# Patient Record
Sex: Male | Born: 1980 | Race: White | Hispanic: No | Marital: Married | Smoking: Never smoker
Health system: Southern US, Community
[De-identification: ages and names within clinical notes are randomized; demographics above are authoritative.]

## PROBLEM LIST (undated history)

## (undated) DIAGNOSIS — T7840XA Allergy, unspecified, initial encounter: Secondary | ICD-10-CM

## (undated) HISTORY — PX: UPPER GASTROINTESTINAL ENDOSCOPY: SHX188

---

## 2014-09-05 ENCOUNTER — Emergency Department (HOSPITAL_COMMUNITY)
Admission: EM | Admit: 2014-09-05 | Discharge: 2014-09-05 | Disposition: A | Discharge status: Home / Self Care | Payer: Worker's Compensation | Attending: Emergency Medicine | Admitting: Emergency Medicine

## 2014-09-05 ENCOUNTER — Encounter (HOSPITAL_COMMUNITY): Payer: Self-pay | Admitting: *Deleted

## 2014-09-05 ENCOUNTER — Emergency Department (HOSPITAL_COMMUNITY): Payer: Worker's Compensation

## 2014-09-05 DIAGNOSIS — W260XXA Contact with knife, initial encounter: Secondary | ICD-10-CM | POA: Diagnosis not present

## 2014-09-05 DIAGNOSIS — R52 Pain, unspecified: Secondary | ICD-10-CM

## 2014-09-05 DIAGNOSIS — Y9389 Activity, other specified: Secondary | ICD-10-CM | POA: Diagnosis not present

## 2014-09-05 DIAGNOSIS — S61012A Laceration without foreign body of left thumb without damage to nail, initial encounter: Secondary | ICD-10-CM

## 2014-09-05 DIAGNOSIS — S61112A Laceration without foreign body of left thumb with damage to nail, initial encounter: Secondary | ICD-10-CM | POA: Diagnosis not present

## 2014-09-05 DIAGNOSIS — Z23 Encounter for immunization: Secondary | ICD-10-CM | POA: Insufficient documentation

## 2014-09-05 DIAGNOSIS — Y998 Other external cause status: Secondary | ICD-10-CM | POA: Insufficient documentation

## 2014-09-05 DIAGNOSIS — Y929 Unspecified place or not applicable: Secondary | ICD-10-CM | POA: Insufficient documentation

## 2014-09-05 MED ORDER — HYDROCODONE-ACETAMINOPHEN 5-325 MG PO TABS: 1.0000 | ORAL_TABLET | ORAL | Status: DC | PRN

## 2014-09-05 MED ORDER — NAPROXEN 500 MG PO TABS: 500.0000 mg | ORAL_TABLET | Freq: Two times a day (BID) | ORAL | Status: DC

## 2014-09-05 MED ORDER — CEPHALEXIN 500 MG PO CAPS: 500.0000 mg | ORAL_CAPSULE | Freq: Four times a day (QID) | ORAL | Status: DC

## 2014-09-05 MED ORDER — TETANUS-DIPHTH-ACELL PERTUSSIS 5-2.5-18.5 LF-MCG/0.5 IM SUSP
0.5000 mL | Freq: Once | INTRAMUSCULAR | Status: AC
  Administered 2014-09-05: 0.5 mL via INTRAMUSCULAR
  Filled 2014-09-05: qty 0.5

## 2014-09-05 MED ORDER — LIDOCAINE HCL 2 % IJ SOLN
10.0000 mL | Freq: Once | INTRAMUSCULAR | Status: AC
Start: 2014-09-05 — End: 2014-09-05
  Administered 2014-09-05: 200 mg
  Filled 2014-09-05: qty 20

## 2014-09-05 NOTE — ED Provider Notes (Signed)
CSN: [id number]     Arrival date & time 09/05/14  1653 History  This chart was scribed for Joe Pel, Joe Griffith, working with Doug Sou, MD by Chestine Spore, ED Scribe. The patient was seen in room TR09C/TR09C at 6:26 PM.    Chief Complaint  Patient presents with  . Laceration      The history is provided by the patient. No language interpreter was used.    HPI Comments: Joe Griffith is a 34 y.o. male who presents to the Emergency Department complaining of laceration onset today 2 hours ago PTA. Pt was chopping cooked lobster with a knife when he cut through his left thumb nail. 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 Pt notes that his finger feels numb at this time and he denies throbbing at this time. Pt is able to move his thumb without any issues. He denies any other symptoms.   History reviewed. No pertinent past medical history. History reviewed. No pertinent past surgical history. No family history on file. History  Substance Use Topics  . Smoking status: Never Smoker   . Smokeless tobacco: Not on file  . Alcohol Use: Yes    Review of Systems  Skin: Positive for wound (laceration to the thumb nail).      Allergies  Review of patient's allergies indicates no known allergies.  Home Medications   Prior to Admission medications   Medication Sig Start Date End Date Taking? Authorizing Provider  cephALEXin (KEFLEX) 500 MG capsule Take 1 capsule (500 mg total) by mouth 4 (four) times daily. 09/05/14   Joe Pel, Joe Griffith  HYDROcodone-acetaminophen (NORCO/VICODIN) 5-325 MG per tablet Take 1-2 tablets by mouth every 4 (four) hours as needed. 09/05/14   Selam Pietsch Neva Seat, Joe Griffith  naproxen (NAPROSYN) 500 MG tablet Take 1 tablet (500 mg total) by mouth 2 (two) times daily. 09/05/14   Areal Cochrane Neva Seat, Joe Griffith   BP 115/60 mmHg  Pulse 77  Temp(Src) 97.6 F (36.4 C)  Resp 16  Ht  (1.753 m)  Wt 150 lb  (68.04 kg)  BMI 22.14 kg/m2  SpO2 99%  Physical Exam  Constitutional: He is oriented to person, place, and time. He appears well-developed and well-nourished. No distress.  HENT:  Head: Normocephalic and atraumatic.  Eyes: EOM are normal.  Neck: Neck supple. No tracheal deviation present.  Cardiovascular: Normal rate.   Pulmonary/Chest: Effort normal. No respiratory distress.  Musculoskeletal: Normal range of motion.       Hands: Laceration to thumb that extends through the distal portion of the nailbed radial side and wraps around towards the posterior portion of the finger. No tendon disruption. Laceration is deep and complex. NVI  Neurological: He is alert and oriented to person, place, and time.  Skin: Skin is warm and dry.  Psychiatric: He has a normal mood and affect. His behavior is normal.  Nursing note and vitals reviewed.   ED Course  Procedures (including critical care time) DIAGNOSTIC STUDIES: Oxygen Saturation is 99% on RA, normal by my interpretation.    COORDINATION OF CARE: 6:32 PM-Discussed treatment plan which includes laceration repair and finger splint with pt at bedside and pt agreed to plan.   Labs Review Labs Reviewed - No data to display  Imaging Review Dg Finger Thumb Left  09/05/2014   CLINICAL DATA:  Thumb injury, laceration through the thumb nail, initial encounter.  EXAM: LEFT THUMB 2+V  COMPARISON:  None.  FINDINGS: Soft tissue swelling over the distal phalanx without underlying acute osseous or joint abnormality. No radiopaque foreign body.  IMPRESSION: Soft tissue injury along the distal thumb without underlying osseous or joint abnormality. No radiopaque foreign body.   Electronically Signed   By: Leanna BattlesMelinda  Blietz M.D.   On: 09/05/2014 18:31     EKG Interpretation None      MDM   Final diagnoses:  Thumb laceration, left, initial encounter    LACERATION REPAIR Performed by: Dorthula MatasGREENE,Kateryn Marasigan G Authorized by: Dorthula MatasGREENE,Alekxander Isola G Consent:  Verbal consent obtained. Risks and benefits: risks, benefits and alternatives were discussed Consent given by: patient Patient identity confirmed: provided demographic data Prepped and Draped in normal sterile fashion Wound explored  Laceration Location: left thumb  Laceration Length: 2 cm  No Foreign Bodies seen or palpated  Anesthesia: Digital Block  Local anesthetic: lidocaine 2% wo epinephrine  Anesthetic total: 4 ml  Irrigation method: syringe Amount of cleaning: standard  Skin closure: sutures  Number of sutures: 6  Technique: simple interrupted, two through the nail bed.  Patient tolerance: Patient tolerated the procedure well with no immediate complications.  Patients lac to distal finger is complex and involves the nail bed. Will start on Keflex, placed in finger splint and refer to hand for follow-up.  34 y.o.Joe Griffith's evaluation in the Emergency Department is complete. It has been determined that no acute conditions requiring further emergency intervention are present at this time. The patient/guardian have been advised of the diagnosis and plan. We have discussed signs and symptoms that warrant return to the ED, such as changes or worsening in symptoms.  Vital signs are stable at discharge. Filed Vitals:   09/05/14 1713  BP: 115/60  Pulse: 77  Temp: 97.6 F (36.4 C)  Resp: 16    Patient/guardian has voiced understanding and agreed to follow-up with the PCP or specialist.   I personally performed the services described in this documentation, which was scribed in my presence. The recorded information has been reviewed and is accurate.   Joe Peliffany Lizza Huffaker, Joe Griffith 09/05/14 2028  Doug SouSam Jacubowitz, MD 09/06/14 [phone]

## 2014-09-05 NOTE — ED Notes (Signed)
The pt has a lac through his lthtumb nail. He did while chopping with a knife just pta.  No active bleeding bandaged

## 2014-09-05 NOTE — Discharge Instructions (Signed)
Fingertip Injuries and Amputations °Fingertip injuries are common and often get injured because they are last to escape when pulling your hand out of harm's way. You have amputated (cut off) part of your finger. How this turns out depends largely on how much was amputated. If just the tip is amputated, often the end of the finger will grow back and the finger may return to much the same as it was before the injury.  °If more of the finger is missing, your caregiver has done the best with the tissue remaining to allow you to keep as much finger as is possible. Your caregiver after checking your injury has tried to leave you with a painless fingertip that has durable, feeling skin. If possible, your caregiver has tried to maintain the finger's length and appearance and preserve its fingernail.  °Please read the instructions outlined below and refer to this sheet in the next few weeks. These instructions provide you with general information on caring for yourself. Your caregiver may also give you specific instructions. While your treatment has been done according to the most current medical practices available, unavoidable complications occasionally occur. If you have any problems or questions after discharge, please call your caregiver. °HOME CARE INSTRUCTIONS  °· You may resume normal diet and activities as directed or allowed. °· Keep your hand elevated above the level of your heart. This helps decrease pain and swelling. °· Keep ice packs (or a bag of ice wrapped in a towel) on the injured area for 15-20 minutes, 03-04 times per day, for the first two days. °· Change dressings if necessary or as directed. °· Clean the wound daily or as directed. °· Only take over-the-counter or prescription medicines for pain, discomfort, or fever as directed by your caregiver. °· Keep appointments as directed. °SEEK IMMEDIATE MEDICAL CARE IF: °· You develop redness, swelling, numbness or increasing pain in the wound. °· There is  pus coming from the wound. °· You develop an unexplained oral temperature above 102° F (38.9° C) or as your caregiver suggests. °· There is a foul (bad) smell coming from the wound or dressing. °· There is a breaking open of the wound (edges not staying together) after sutures or staples have been removed. °MAKE SURE YOU:  °· Understand these instructions. °· Will watch your condition. °· Will get help right away if you are not doing well or get worse. °Document Released: 04/12/2005 Document Revised: 08/14/2011 Document Reviewed: 03/11/2008 °ExitCare® Patient Information ©2015 [employer]. This information is not intended to replace advice given to you by your health care provider. Make sure you discuss any questions you have with your health care provider. ° °

## 2019-06-04 ENCOUNTER — Other Ambulatory Visit: Payer: Self-pay

## 2019-06-04 ENCOUNTER — Ambulatory Visit: Payer: BC Managed Care – PPO | Attending: Internal Medicine

## 2019-06-04 DIAGNOSIS — Z20822 Contact with and (suspected) exposure to covid-19: Secondary | ICD-10-CM

## 2019-06-05 LAB — NOVEL CORONAVIRUS, NAA: SARS-CoV-2, NAA: NOT DETECTED

## 2019-06-30 ENCOUNTER — Ambulatory Visit: Payer: BC Managed Care – PPO | Attending: Internal Medicine

## 2019-06-30 ENCOUNTER — Other Ambulatory Visit: Payer: Self-pay

## 2019-06-30 DIAGNOSIS — Z20822 Contact with and (suspected) exposure to covid-19: Secondary | ICD-10-CM

## 2019-07-01 LAB — NOVEL CORONAVIRUS, NAA: SARS-CoV-2, NAA: NOT DETECTED

## 2019-12-23 ENCOUNTER — Ambulatory Visit
Admission: RE | Admit: 2019-12-23 | Discharge: 2019-12-23 | Disposition: A | Discharge status: Home / Self Care | Payer: BC Managed Care – PPO | Source: Ambulatory Visit | Attending: Sports Medicine | Admitting: Sports Medicine

## 2019-12-23 ENCOUNTER — Ambulatory Visit: Payer: BC Managed Care – PPO | Admitting: Sports Medicine

## 2019-12-23 ENCOUNTER — Encounter: Payer: Self-pay | Admitting: Sports Medicine

## 2019-12-23 ENCOUNTER — Other Ambulatory Visit: Payer: Self-pay

## 2019-12-23 VITALS — BP 128/78 | Ht 69.0 in | Wt 154.0 lb

## 2019-12-23 DIAGNOSIS — G8929 Other chronic pain: Secondary | ICD-10-CM | POA: Diagnosis not present

## 2019-12-23 DIAGNOSIS — M545 Low back pain, unspecified: Secondary | ICD-10-CM

## 2019-12-23 NOTE — Progress Notes (Signed)
Office Visit Note   Patient: Joe Griffith           Date of Birth: [birth year]           MRN: [id number] Visit Date: 12/23/2019 Requested by: No referring provider defined for this encounter. PCP: No primary care provider on file.  Subjective: Chronic Low Back Pain  HPI: Patient is a 39 year old male presented to clinic for chronic low back pain which has been bothering him for 10 years.  He denies any specific trauma when the pain started, and describes an insidious worsening despite Advil attempts at stretching, back exercises, core exercises, chiropractic care, and physical therapy treatment.  Patient states that pain has in the past radiated to bilateral legs (L>R), though it primarily stays local in his back.  It is focused just above his hips and tends to affect his left side more than his right.  He was given meloxicam by his primary care manager and he also uses a TENS unit, heating pad, and massage, all with very brief improvement.  He states that any bending movements cause significant worsening in his pain.  He is also noticed that if he takes a day off of his exercise routine his pain will significantly worsen, and he feels very stiff afterward.  However, if he is diligent with his exercises and is stretching he feels "pretty good."  He denies a family history of autoimmune diseases, though states his father does have a "bad back like I do."  He has a history of scoliosis that was diagnosed in childhood, though admits he has ignored this diagnosis for most of his life.  He is worried that his worsening pain is a sign that his scoliosis is catching up with him.  He denies any weakness in his feet, or numbness in his toes or legs.  No trauma that he can recall.  Patient states that overall he is very "frustrated" today because he is doing everything he can think of to improve his symptoms, and they remain a daily burden to him.              ROS:   All other systems were reviewed and are  negative.  Objective: Vital Signs: BP 128/78   Ht 5\' 9"  (1.753 m)   Wt 154 lb (69.9 kg)   BMI 22.74 kg/m   Physical Exam:  General:  Alert and oriented, in no acute distress. Pulm:  Breathing unlabored. Psy:  Normal mood, congruent affect. Skin:  No rashes or bruising on examination.   BACK EXAMINATION: Normal Gait.  Normal Spinal curvature, without excessive lumbar lordosis, or thoracic kyphosis, or. Subtle Left rib-hump with forward flexion.  ROM: Endorses pain with with forward flexion > extension. Able to achieve Toe-Touch. No pain with Rotation or Side-bending.  Palpation: No midline tenderness, no deformity or step-offs. Minimal lumbar paraspinal muscle tenderness (L>R), and no tenderness over the SI Joints bilaterally. Gluteus musculature and piriformis with tender points on left, though this does not cause radiation into leg. Negative Sacral Spring's test.  Strength: Hip flexion (L1), Hip Aduction (L2), Knee Extension (L3) are 5/5 Bilaterally. Sensation: Intact to light touch medial and lateral aspects of lower extremities, and lateral, dorsal, and medial aspects of foot.  Reflexes: Patellar (L4), and Ankle (S1) 2+ Bilaterally Special Tests:  FABER and Gaenslens cause No SI Joint Pain. Piriformis test: No pain or radiation into leg with hip flexion, adduction SLR: No radiation down Ipilateral or contralateral leg bilaterally. Limb  Length: Hips Aligned, No obvious discrepancy at medial malleolus.     Imaging: No results found.  Assessment & Plan: Patient is a 39 year old male presenting to clinic today with concerns of 10 years of chronic low back pain.  He denies injury at the onset of his symptoms, and states that it has insidiously worsened despite his best attempts at staying active as recommended by his primary care.  Examination as described above with no red flag signs or symptoms.  Does have possible subtle scoliotic curve as evidenced by left-sided rib hump with  forward flexion, which may be underlying his some of his chronic pain.   -We will obtain lumbar and pelvic films to evaluate for underlying bony pathology which could be causing his symptoms. -Patient was encouraged to reach out to physical therapy for consideration of Pilates, which may offer excellent benefit to his core strengthening routine. -Given lack of red flag symptoms no indication for MRI today.  Patient will follow up pending physical therapy and aforementioned radiographs for further evaluation as needed.  Patient had no further questions or concerns and was agreeable with plan.  Patient seen and evaluated with the sports medicine fellow.  I agree with the above plan of care.  X-rays are reviewed.  Nothing acute is seen.  Patient has minimal disc space narrowing at L4-L5 but otherwise unremarkable.  I would like for the patient to try Pilates and follow-up with [place] in 6 weeks.  Call with questions or concerns in the interim.

## 2020-02-03 ENCOUNTER — Ambulatory Visit: Payer: BC Managed Care – PPO | Admitting: Sports Medicine

## 2020-02-03 ENCOUNTER — Other Ambulatory Visit: Payer: Self-pay

## 2020-02-03 VITALS — BP 118/72 | Ht 69.0 in | Wt 153.0 lb

## 2020-02-03 DIAGNOSIS — M545 Low back pain: Secondary | ICD-10-CM

## 2020-02-03 DIAGNOSIS — G8929 Other chronic pain: Secondary | ICD-10-CM | POA: Diagnosis not present

## 2020-02-03 NOTE — Progress Notes (Signed)
Office Visit Note   Patient: Joe Griffith           Date of Birth: [birth year]           MRN: [id number] Visit Date: 02/03/2020 Requested by: No referring provider defined for this encounter. PCP: Darrow Bussing, MD  Subjective: CC: F/U Low Back Pain  HPI: 39 year old male presenting to clinic for 6-week follow-up for low back pain.  At previous encounter patient was diagnosed with mechanical low back pain, and it was recommended that he engage with PT Pilates for core and hip exercises to improve her symptoms.  He states that he goes to the Pilates studio once weekly, in addition to streaming Pilates workouts at home, and feels that overall this has improved his symptoms.  Previously, he would be able to do 1 round of golf on weekends, and then be in significant pain the next day.  Last weekend, however, he was able to complete 18 holes on Friday, and play another 18 holes on Saturday without much discomfort.  He does admit that he was stiff on Sunday, but he was able to do his Pilates exercises and this significantly improved that pain and stiffness.  He feels very happy with how things are progressing, though feels that he still has room to improve.  He is optimistic that as Covid restrictions eventually release, he will be able to return to the gym for cardio as well as weight lifting, which she thinks will complement the Pilates and get him feeling much better overall.  He denies numbness, weakness, or bowel or bladder dysfunction.               ROS:   All other systems were reviewed and are negative.  Objective: Vital Signs: BP 118/72   Ht 5\' 9"  (1.753 m)   Wt 153 lb (69.4 kg)   BMI 22.59 kg/m   Physical Exam:  General:  Alert and oriented, in no acute distress. Pulm:  Breathing unlabored. Psy:  Normal mood, congruent affect. Skin: No bruising or rashes appreciated. BACK EXAMINATION: Normal Gait.  Normal Spinal curvature, without excessive lumbar lordosis, thoracic kyphosis, or  scoliosis.  ROM: No Pain with forward flexion or extension. Able to achieve Toe-Touch (which is improved from past visit). No pain with Rotation or Side-bending.  Palpation: No midline tenderness, no deformity or step-offs. No paraspinal muscle tenderness. Does endorse minimal pain over bilateral SI joints.  Some tenderness with palpation of gluten medius bilaterally.  Negative Sacral Spring's test.  Osteopathic Examination:L5-L3NRRSL Strength: Hip flexion (L1), Hip Aduction (L2), Knee Extension (L3) are 5/5 Bilaterally Foot Inversion (L4), Dorsiflexion (L5), and Eversion (S1) 5/5 Bilaterally Sensation: Intact to light touch medial and lateral aspects of lower extremities, and lateral, dorsal, and medial aspects of foot.   Imaging: CLINICAL DATA:  Low back pain extending into the lower extremities, left greater than right.  EXAM: LUMBAR SPINE - 2-3 VIEW  COMPARISON:  None.  FINDINGS: Five non rib-bearing lumbar type vertebral bodies are present. Transitional L5 segment is noted. Vertebral body heights are maintained. Narrowing of disc space is noted at L4-5. Facet degenerative changes are present at L4-5 as well. Disc spaces are otherwise maintained. No focal osseous lesions are evident.  IMPRESSION: 1. 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 2. Transitional L5 segment.   Electronically Signed   By: M.D.   On: 12/24/2019 17:06  Assessment & Plan: 39 year old male presenting to clinic to follow-up on mechanical  lower back pain.  Overall he feels that his symptoms have improved since starting Pilates as recommended at her last encounter.  He says that he is pleasantly surprised by the improvements he has made, and by his increased exercise tolerance.  -Discussed continued Pilates, as this improves core strengthening, flexibility, and should overall help support his lumbar spine.  -Reviewed radiographs obtained at previous encounter, which did  demonstrate some degenerative changes of L4-L5.  This furthers the need for core strengthening and spinal stability exercises.  -Follow-up as needed should pain worsen, or trajectory of improvement failed to offer adequate relief with advancing in Pilates PT.  -Patient had no further questions or concerns at completion of today's visit.    Patient seen and evaluated with the sports medicine fellow.  I agree with the above plan of care.  Patient has noticed some initial improvement with Pilates.  I have encouraged him to continue with this indefinitely.  No restrictions on activity.  Follow-up as needed.

## 2020-08-09 ENCOUNTER — Other Ambulatory Visit: Payer: Self-pay

## 2020-08-09 ENCOUNTER — Ambulatory Visit
Admission: RE | Admit: 2020-08-09 | Discharge: 2020-08-09 | Disposition: A | Discharge status: Home / Self Care | Payer: BC Managed Care – PPO | Source: Ambulatory Visit | Attending: Sports Medicine | Admitting: Sports Medicine

## 2020-08-09 ENCOUNTER — Ambulatory Visit: Payer: BC Managed Care – PPO | Admitting: Sports Medicine

## 2020-08-09 VITALS — BP 130/78 | Ht 69.0 in | Wt 155.0 lb

## 2020-08-09 DIAGNOSIS — M25512 Pain in left shoulder: Secondary | ICD-10-CM | POA: Diagnosis not present

## 2020-08-09 DIAGNOSIS — M25532 Pain in left wrist: Secondary | ICD-10-CM | POA: Diagnosis not present

## 2020-08-09 NOTE — Progress Notes (Signed)
   Subjective:    Patient ID: Joe Griffith, male    DOB: August 27, 1980, 40 y.o.   MRN: [id number]  HPI chief complaint: Left shoulder and left wrist pain  Joe Griffith comes in today complaining of left shoulder and wrist pain that began after he slipped on some ice in early February.  As his foot slipped out from under him he suffered a hyperextension injury to his left shoulder as well as an injury to the radial side of his left wrist.  At the time of his injury he felt a "crunch" but was able to get up and move around okay.  Since then, he has had diffuse shoulder pain with certain movements, especially with external rotation.  He has had difficulty sleeping on this side at night.  He denies any problems with the shoulder in the past.  He did not notice any bruising or swelling at the time of his injury.  No prior shoulder surgeries.  In regards to the right wrist, he has radial sided wrist pain when doing any sort of weightbearing exercise.  He did not notice any swelling at the time of his injury.  He denies any pain along the ulnar wrist.  Interim medical history reviewed Medications reviewed Allergies reviewed    Review of Systems    As above Objective:   Physical Exam  Well-developed, well-nourished.  No acute distress.  Left shoulder: Patient demonstrates full active and passive range of motion.  No tenderness to palpation along the clavicle, acromioclavicular joint, or bicipital groove.  No obvious swelling.  Rotator cuff strength is 5/5 and does not reproduce pain.  Negative O'Brien's.  He does have pain with apprehension testing.  Left wrist: Full range of motion.  No effusion.  No soft tissue swelling.  He is tender to palpation diffusely along the distal radius.  No tenderness to palpation in the anatomic snuffbox.  No tenderness over the scapholunate ligament nor over the TFCC.  Good pulses.  X-rays of the left shoulder including AP, and axillary views as well as x-rays of the left  wrist including AP, lateral, and scaphoid view show no obvious fracture.      Assessment & Plan:  Left shoulder pain-rule out rotator cuff tear Left wrist pain likely secondary to contusion  Patient will return to the office in the next few days for an ultrasound of his left shoulder specifically to rule out rotator cuff tear.  We will place him into a thumb spica brace for the left wrist for the next couple of weeks.  If pain persists thereafter, we may need to consider further diagnostic imaging.

## 2020-08-10 ENCOUNTER — Ambulatory Visit: Payer: BC Managed Care – PPO | Admitting: Sports Medicine

## 2020-08-12 ENCOUNTER — Ambulatory Visit (INDEPENDENT_AMBULATORY_CARE_PROVIDER_SITE_OTHER): Payer: BC Managed Care – PPO | Admitting: Sports Medicine

## 2020-08-12 ENCOUNTER — Other Ambulatory Visit: Payer: Self-pay

## 2020-08-12 ENCOUNTER — Ambulatory Visit: Payer: Self-pay

## 2020-08-12 VITALS — BP 110/78 | Ht 69.0 in | Wt 155.0 lb

## 2020-08-12 DIAGNOSIS — M25512 Pain in left shoulder: Secondary | ICD-10-CM

## 2020-08-12 NOTE — Patient Instructions (Signed)
There is no evidence of a rotator cuff tear on the ultrasound today, but that doesn't exclude the possibility of a labral tear (Labrums are very hard to see on Ultrasound).  - Despite not having a Rotator Cuff injury, doing exercises for your Rotator Cuff can help support the labrum and help it heal. - Do these Exercises twice daily until your follow up appointment.  - Take Ibuprofen, Tylenol as you need to for pain - Follow up in 4 weeks

## 2020-08-12 NOTE — Progress Notes (Addendum)
   Office Visit Note   Patient: Joe Griffith           Date of Birth: [birth year]           MRN: [id number] Visit Date: 08/12/2020 Requested by: Darrow Bussing, MD [address] [place],  [place] [address] PCP: Darrow Bussing, MD  Subjective: CC: Left Shoulder Ultrasound  HPI: 40 year old male presenting to clinic for dedicated left shoulder ultrasound.  Patient initially injured his shoulder approximately 1 month ago in early February, when he slipped on ice and jerked his shoulder behind him.  Since this time, he has had intermittent pain of the left shoulder, specifically when trying to sleep on the side.  He has difficulty pinpointing where the exact pain is located, as well as has trouble with knowing exactly what movements or exercises aggravated.  He states "I just know something is not right and there."              ROS:   All other systems were reviewed and are negative.  Objective: Vital Signs: BP 110/78   Ht 5\' 9"  (1.753 m)   Wt 155 lb (70.3 kg)   BMI 22.89 kg/m   Physical Exam:  General:  Alert and oriented, in no acute distress. Pulm:  Breathing unlabored. Psy:  Normal mood, congruent affect. Skin: Left shoulder overlying skin intact.  No bruising, no rashes, no erythema. Left shoulder with no obvious asymmetry.  Full range of motion in bilateral shoulders without any pain.   Full strength and no pain with empty can, does endorse pain with O'Brien/speeds as well as pain with crank, though no obvious clunks are appreciated.  Imaging: Upper extremity ultrasound- Left shoulder:  Biceps tendon visualized in long and short axis, with no significant surrounding fluid.  Fibers appear intact. Subscapularis tendon intact, with no significant surrounding effusion. Supraspinatus appears intact, no significant cortical irregularities, effusions, or intratendinous defects. No significant enlargement of subacromial bursa appreciated. Infraspinatus and teres minor  both appear intact, without surrounding fluid. AC joint visualized, no significant bony spurring.  Negative geyser sign. Glenohumeral joint visualized without significant bony spurring or effusion.  No obvious posterior labral pathology.  Impression: Unremarkable left shoulder ultrasound   Assessment & Plan: 40 year old male presenting to clinic for left shoulder ultrasound after injuring it while falling on the ice approximately 1 month ago.  Ultrasound performed as described above, with no obvious rotator cuff pathology.  He has no fluid around the biceps or AC joint, and no obvious posterior labral pathology- though this is difficult to fully visualize. -Reassuring against rotator cuff pathology, suspect smaller labral injury that is difficult to pick up on the ultrasound. -We will start conservative care with shoulder stabilization and scapular stabilization exercises. -Return to clinic in 4 weeks for reevaluation.  If patient has no improvement at that time, could consider advanced imaging. -She expresses understanding with plan.   Patient seen and evaluated with the sports medicine fellow.  Ultrasound findings as above.  No obvious rotator cuff tear.  Proceed with treatment as above for both his left shoulder and left wrist.  Follow-up in 4 weeks.  If pain persists in either area, we will need to consider further diagnostic imaging.  Patient will call with questions or concerns in the interim.

## 2020-09-07 ENCOUNTER — Ambulatory Visit: Payer: BC Managed Care – PPO | Admitting: Sports Medicine

## 2020-09-07 ENCOUNTER — Other Ambulatory Visit: Payer: Self-pay

## 2020-09-07 VITALS — BP 118/80 | Ht 69.0 in | Wt 155.0 lb

## 2020-09-07 DIAGNOSIS — M25512 Pain in left shoulder: Secondary | ICD-10-CM | POA: Diagnosis not present

## 2020-09-07 DIAGNOSIS — M25532 Pain in left wrist: Secondary | ICD-10-CM

## 2020-09-07 NOTE — Patient Instructions (Addendum)
Please call [facility] to schedule your MRI arthrogram of your left shoulder. We are going to refer you to a hand specialist  for your left wrist.  Dr. Mina Marble [facility] [address], [place] [address] [phone]  Appt: 09/16/20 @ 9 am, please arrive at 8:45 am for check in.  They would like you to bring a copy of your wrist x-rays to the appt. Please stop by [facility] where you had your x-rays done and ask for a copy of the images.

## 2020-09-08 NOTE — Progress Notes (Signed)
Patient ID: JORGEN WOLFINGER, male   DOB: 02/12/1981, 40 y.o.   MRN: [id number]  Lyndell presents today for follow-up on left shoulder and left wrist pain.  There is been no real change in his pain since his last visit.  He has been doing his home exercises but they are causing him pain.  He has also been wearing his left wrist brace intermittently.  His left shoulder pain is most noticeable with the arm in an abducted and externally rotated position.  Shoulder pain and wrist pain both started after falling several weeks ago.  X-rays have been unremarkable.  Physical exam was not repeated today.  We simply talked about work-up and treatment going forward.  At this point in time, I would like to order an MRI arthrogram of the left shoulder specifically to rule out a labral tear that he may have suffered at the time of his injury.  Previous ultrasound did not show any evidence of a rotator cuff tear but we will evaluate that further with MRI as well.  In regards to his persistent left wrist pain, I would like to refer him to one of the orthopedic hand specialists.  He may have suffered a ligamentous injury requiring further work-up but I will defer that decision to the hand specialist.  I will follow up with him via telephone with the results of his left shoulder MRI arthrogram when available and we will delineate further treatment based on those findings.

## 2020-10-01 ENCOUNTER — Ambulatory Visit
Admission: RE | Admit: 2020-10-01 | Discharge: 2020-10-01 | Disposition: A | Discharge status: Home / Self Care | Payer: BC Managed Care – PPO | Source: Ambulatory Visit | Attending: Sports Medicine | Admitting: Sports Medicine

## 2020-10-01 ENCOUNTER — Other Ambulatory Visit: Payer: Self-pay

## 2020-10-01 DIAGNOSIS — M25512 Pain in left shoulder: Secondary | ICD-10-CM

## 2020-10-01 MED ORDER — IOPAMIDOL (ISOVUE-M 200) INJECTION 41%
15.0000 mL | Freq: Once | INTRAMUSCULAR | Status: AC
  Administered 2020-10-01: 15 mL via INTRA_ARTICULAR

## 2020-10-05 ENCOUNTER — Telehealth: Payer: Self-pay | Admitting: Sports Medicine

## 2020-10-05 NOTE — Telephone Encounter (Signed)
Dr. Everardo Pacific - left shoulder labral tear [facility] [address] [place], [place] [phone]  Appt: Friday 10/08/20 @ 8:30 am, 8:15 am arrival.

## 2020-10-05 NOTE — Telephone Encounter (Signed)
  I spoke with Joe Griffith on the phone yesterday after reviewing MRI arthrogram findings of his left shoulder.  He has a small anterior inferior labral tear.  He has had symptoms for several months now.  I would like to refer him to Dr. Everardo Pacific.  Patient is agreement with that plan.  Patient will follow up with me as needed.

## 2020-10-21 ENCOUNTER — Other Ambulatory Visit (HOSPITAL_BASED_OUTPATIENT_CLINIC_OR_DEPARTMENT_OTHER): Payer: Self-pay

## 2020-10-21 MED ORDER — OSELTAMIVIR PHOSPHATE 75 MG PO CAPS
ORAL_CAPSULE | ORAL | 0 refills | Status: DC
  Filled 2020-10-21: qty 10, 5d supply, fill #0

## 2020-11-09 ENCOUNTER — Other Ambulatory Visit: Payer: Self-pay

## 2020-11-09 ENCOUNTER — Encounter (HOSPITAL_BASED_OUTPATIENT_CLINIC_OR_DEPARTMENT_OTHER): Payer: Self-pay | Admitting: Orthopaedic Surgery

## 2020-11-15 NOTE — H&P (Signed)
PREOPERATIVE H&P  Chief Complaint: LEFT SHOULDER BURSITIS CARTILAGE DISORDERS, BICIPITAL TENDINITIS AND DISLOCATION  HPI: Joe Griffith is a 40 y.o. male who is scheduled for, Procedure(s): LEFT SHOULDER ARTHROSCOPY WITH SUBACROMIAL DECOMPRESSION,  BICEP TENODESIS AND CAPSULORRHAPHY.   Patient has a past medical history significant for allergies.   The patient is a 40 year old who has had left shoulder pain since he slipped and caught himself on the ice in February.  He has had pain since then.  He tried nonoperative measures with Dr. Margaretha Sheffield including physical therapy and antiinflammatories.  He hasn't found himself to be improved.  He is not able to lift weights or play golf.    His symptoms are rated as moderate to severe, and have been worsening.  This is significantly impairing activities of daily living.    Please see clinic note for further details on this patient's care.    He has elected for surgical management.   Past Medical History:  Diagnosis Date   Allergies    Past Surgical History:  Procedure Laterality Date   UPPER GASTROINTESTINAL ENDOSCOPY     Social History   Socioeconomic History   Marital status: Married    Spouse name: Not on file   Number of children: Not on file   Years of education: Not on file   Highest education level: Not on file  Occupational History   Not on file  Tobacco Use   Smoking status: Never   Smokeless tobacco: Never  Substance and Sexual Activity   Alcohol use: Yes    Comment: 4-5a week   Drug use: Never   Sexual activity: Not on file  Other Topics Concern   Not on file  Social History Narrative   Not on file   Social Determinants of Health   Financial Resource Strain: Not on file  Food Insecurity: Not on file  Transportation Needs: Not on file  Physical Activity: Not on file  Stress: Not on file  Social Connections: Not on file   History reviewed. No pertinent family history. Allergies  Allergen Reactions    Amoxicillin-Pot Clavulanate Hives   Prior to Admission medications   Medication Sig Start Date End Date Taking? Authorizing Provider  Cetirizine HCl (ZYRTEC ALLERGY) 10 MG CAPS Take by mouth.   Yes [provider]  fluticasone (FLONASE) 50 MCG/ACT nasal spray Place into both nostrils daily.   Yes [provider]  montelukast (SINGULAIR) 10 MG tablet Take 10 mg by mouth at bedtime.   Yes [provider]    ROS: All other systems have been reviewed and were otherwise negative with the exception of those mentioned in the HPI and as above.  Physical Exam: General: Alert, no acute distress Cardiovascular: No pedal edema Respiratory: No cyanosis, no use of accessory musculature GI: No organomegaly, abdomen is soft and non-tender Skin: No lesions in the area of chief complaint Neurologic: Sensation intact distally Psychiatric: Patient is competent for consent with normal mood and affect Lymphatic: No axillary or cervical lymphadenopathy  MUSCULOSKELETAL:  Left shoulder: Active forward elevation to 170; external rotation to 60.  He has pain with abduction and external rotation.  He has pain with Speed's.  He has positive posterior load and shift and anterior apprehension signs.   Imaging: MRI demonstrates an anterior labral tear.  No obvious SLAP tear.  Cuff appears to be intact.  Assessment: LEFT SHOULDER BURSITIS CARTILAGE DISORDERS, BICIPITAL TENDINITIS AND DISLOCATION  Plan: Plan for Procedure(s): LEFT SHOULDER ARTHROSCOPY WITH  SUBACROMIAL DECOMPRESSION,  BICEP TENODESIS AND CAPSULORRHAPHY  The risks benefits and alternatives were discussed with the patient including but not limited to the risks of nonoperative treatment, versus surgical intervention including infection, bleeding, nerve injury,  blood clots, cardiopulmonary complications, morbidity, mortality, among others, and they were willing to proceed.   The patient acknowledged the explanation, agreed  to proceed with the plan and consent was signed.   Operative Plan: Left shoulder scope with SAD, labral repair versus debridement, possible biceps tenodesis in beach chair position Discharge Medications: Tylenol, Meloxicam 15, Oxycodone, Zofran DVT Prophylaxis: None Physical Therapy: Outpatient PT Special Discharge needs: Sling. IceMan.    Vernetta Honey, PA-C  11/15/2020 2:37 PM

## 2020-11-15 NOTE — Progress Notes (Signed)

## 2020-11-17 ENCOUNTER — Encounter (HOSPITAL_BASED_OUTPATIENT_CLINIC_OR_DEPARTMENT_OTHER): Payer: Self-pay | Admitting: Orthopaedic Surgery

## 2020-11-17 ENCOUNTER — Ambulatory Visit (HOSPITAL_BASED_OUTPATIENT_CLINIC_OR_DEPARTMENT_OTHER)
Admission: RE | Admit: 2020-11-17 | Discharge: 2020-11-17 | Disposition: A | Discharge status: Home / Self Care | Payer: BC Managed Care – PPO | Attending: Orthopaedic Surgery | Admitting: Orthopaedic Surgery

## 2020-11-17 ENCOUNTER — Ambulatory Visit (HOSPITAL_BASED_OUTPATIENT_CLINIC_OR_DEPARTMENT_OTHER): Payer: BC Managed Care – PPO | Admitting: Anesthesiology

## 2020-11-17 ENCOUNTER — Other Ambulatory Visit: Payer: Self-pay

## 2020-11-17 ENCOUNTER — Other Ambulatory Visit (HOSPITAL_BASED_OUTPATIENT_CLINIC_OR_DEPARTMENT_OTHER): Payer: Self-pay

## 2020-11-17 ENCOUNTER — Encounter (HOSPITAL_BASED_OUTPATIENT_CLINIC_OR_DEPARTMENT_OTHER)
Admission: RE | Disposition: A | Discharge status: Home / Self Care | Payer: Self-pay | Source: Home / Self Care | Attending: Orthopaedic Surgery

## 2020-11-17 DIAGNOSIS — S43005A Unspecified dislocation of left shoulder joint, initial encounter: Secondary | ICD-10-CM | POA: Diagnosis not present

## 2020-11-17 DIAGNOSIS — M25812 Other specified joint disorders, left shoulder: Secondary | ICD-10-CM | POA: Diagnosis not present

## 2020-11-17 DIAGNOSIS — W000XXA Fall on same level due to ice and snow, initial encounter: Secondary | ICD-10-CM | POA: Insufficient documentation

## 2020-11-17 DIAGNOSIS — M25312 Other instability, left shoulder: Secondary | ICD-10-CM | POA: Insufficient documentation

## 2020-11-17 DIAGNOSIS — M7522 Bicipital tendinitis, left shoulder: Secondary | ICD-10-CM | POA: Diagnosis not present

## 2020-11-17 DIAGNOSIS — M7552 Bursitis of left shoulder: Secondary | ICD-10-CM | POA: Diagnosis present

## 2020-11-17 DIAGNOSIS — Z880 Allergy status to penicillin: Secondary | ICD-10-CM | POA: Diagnosis not present

## 2020-11-17 HISTORY — DX: Allergy, unspecified, initial encounter: T78.40XA

## 2020-11-17 HISTORY — PX: SHOULDER ARTHROSCOPY WITH CAPSULORRHAPHY: SHX6454

## 2020-11-17 HISTORY — PX: SHOULDER ARTHROSCOPY WITH SUBACROMIAL DECOMPRESSION: SHX5684

## 2020-11-17 SURGERY — SHOULDER ARTHROSCOPY WITH SUBACROMIAL DECOMPRESSION
Anesthesia: Regional | Site: Shoulder | Laterality: Left

## 2020-11-17 MED ORDER — MIDAZOLAM HCL 2 MG/2ML IJ SOLN
INTRAMUSCULAR | Status: AC
  Filled 2020-11-17: qty 2

## 2020-11-17 MED ORDER — FENTANYL CITRATE (PF) 100 MCG/2ML IJ SOLN
INTRAMUSCULAR | Status: AC
  Filled 2020-11-17: qty 2

## 2020-11-17 MED ORDER — MELOXICAM 15 MG PO TABS
15.0000 mg | ORAL_TABLET | Freq: Every day | ORAL | 0 refills | Status: AC
  Filled 2020-11-17: qty 30, 30d supply, fill #0

## 2020-11-17 MED ORDER — CEFAZOLIN SODIUM-DEXTROSE 2-4 GM/100ML-% IV SOLN
INTRAVENOUS | Status: AC
  Filled 2020-11-17: qty 100

## 2020-11-17 MED ORDER — ACETAMINOPHEN 10 MG/ML IV SOLN: 1000.0000 mg | Freq: Once | INTRAVENOUS | Status: DC | PRN

## 2020-11-17 MED ORDER — ROCURONIUM BROMIDE 100 MG/10ML IV SOLN
INTRAVENOUS | Status: DC | PRN
  Administered 2020-11-17: 60 mg via INTRAVENOUS

## 2020-11-17 MED ORDER — DEXAMETHASONE SODIUM PHOSPHATE 10 MG/ML IJ SOLN
INTRAMUSCULAR | Status: AC
  Filled 2020-11-17: qty 1

## 2020-11-17 MED ORDER — ROCURONIUM BROMIDE 10 MG/ML (PF) SYRINGE
PREFILLED_SYRINGE | INTRAVENOUS | Status: AC
  Filled 2020-11-17: qty 10

## 2020-11-17 MED ORDER — EPHEDRINE 5 MG/ML INJ
INTRAVENOUS | Status: AC
  Filled 2020-11-17: qty 10

## 2020-11-17 MED ORDER — ONDANSETRON HCL 4 MG/2ML IJ SOLN
INTRAMUSCULAR | Status: DC | PRN
  Administered 2020-11-17: 4 mg via INTRAVENOUS

## 2020-11-17 MED ORDER — BUPIVACAINE-EPINEPHRINE (PF) 0.5% -1:200000 IJ SOLN
INTRAMUSCULAR | Status: DC | PRN
  Administered 2020-11-17: 15 mL via PERINEURAL

## 2020-11-17 MED ORDER — LIDOCAINE HCL (PF) 2 % IJ SOLN
INTRAMUSCULAR | Status: AC
  Filled 2020-11-17: qty 5

## 2020-11-17 MED ORDER — CEFAZOLIN SODIUM-DEXTROSE 2-4 GM/100ML-% IV SOLN
2.0000 g | INTRAVENOUS | Status: AC
  Administered 2020-11-17: 2 g via INTRAVENOUS

## 2020-11-17 MED ORDER — ACETAMINOPHEN 500 MG PO TABS: 1000.0000 mg | ORAL_TABLET | Freq: Once | ORAL | Status: DC | PRN

## 2020-11-17 MED ORDER — OXYCODONE HCL 5 MG/5ML PO SOLN
5.0000 mg | Freq: Once | ORAL | Status: DC | PRN
Start: 2020-11-17 — End: 2020-11-17

## 2020-11-17 MED ORDER — ACETAMINOPHEN 500 MG PO TABS
1000.0000 mg | ORAL_TABLET | Freq: Three times a day (TID) | ORAL | 0 refills | Status: AC
  Filled 2020-11-17: qty 100, 17d supply, fill #0

## 2020-11-17 MED ORDER — BUPIVACAINE LIPOSOME 1.3 % IJ SUSP
INTRAMUSCULAR | Status: DC | PRN
  Administered 2020-11-17: 133 mg via PERINEURAL

## 2020-11-17 MED ORDER — LIDOCAINE HCL (CARDIAC) PF 100 MG/5ML IV SOSY
PREFILLED_SYRINGE | INTRAVENOUS | Status: DC | PRN
  Administered 2020-11-17: 60 mg via INTRAVENOUS

## 2020-11-17 MED ORDER — PROPOFOL 10 MG/ML IV BOLUS
INTRAVENOUS | Status: AC
  Filled 2020-11-17: qty 20

## 2020-11-17 MED ORDER — FENTANYL CITRATE (PF) 100 MCG/2ML IJ SOLN
100.0000 ug | Freq: Once | INTRAMUSCULAR | Status: AC
Start: 2020-11-17 — End: 2020-11-17
  Administered 2020-11-17: 50 ug via INTRAVENOUS

## 2020-11-17 MED ORDER — ONDANSETRON HCL 4 MG PO TABS
4.0000 mg | ORAL_TABLET | Freq: Three times a day (TID) | ORAL | 0 refills | Status: AC | PRN
  Filled 2020-11-17: qty 10, 4d supply, fill #0

## 2020-11-17 MED ORDER — OXYCODONE HCL 5 MG PO TABS: 5.0000 mg | ORAL_TABLET | Freq: Once | ORAL | Status: DC | PRN

## 2020-11-17 MED ORDER — ACETAMINOPHEN 160 MG/5ML PO SOLN: 1000.0000 mg | Freq: Once | ORAL | Status: DC | PRN

## 2020-11-17 MED ORDER — SUGAMMADEX SODIUM 200 MG/2ML IV SOLN
INTRAVENOUS | Status: DC | PRN
  Administered 2020-11-17: 150 mg via INTRAVENOUS

## 2020-11-17 MED ORDER — OXYCODONE HCL 5 MG PO TABS
ORAL_TABLET | ORAL | 0 refills | Status: AC
  Filled 2020-11-17: qty 24, 4d supply, fill #0

## 2020-11-17 MED ORDER — FENTANYL CITRATE (PF) 100 MCG/2ML IJ SOLN: 25.0000 ug | INTRAMUSCULAR | Status: DC | PRN

## 2020-11-17 MED ORDER — FENTANYL CITRATE (PF) 100 MCG/2ML IJ SOLN
INTRAMUSCULAR | Status: DC | PRN
  Administered 2020-11-17: 50 ug via INTRAVENOUS

## 2020-11-17 MED ORDER — EPINEPHRINE PF 1 MG/ML IJ SOLN
INTRAMUSCULAR | Status: AC
  Filled 2020-11-17: qty 4

## 2020-11-17 MED ORDER — LACTATED RINGERS IV SOLN: INTRAVENOUS | Status: DC

## 2020-11-17 MED ORDER — ONDANSETRON HCL 4 MG/2ML IJ SOLN
INTRAMUSCULAR | Status: AC
  Filled 2020-11-17: qty 2

## 2020-11-17 MED ORDER — EPHEDRINE SULFATE 50 MG/ML IJ SOLN
INTRAMUSCULAR | Status: DC | PRN
  Administered 2020-11-17: 10 mg via INTRAVENOUS
  Administered 2020-11-17 (×2): 5 mg via INTRAVENOUS

## 2020-11-17 MED ORDER — DEXAMETHASONE SODIUM PHOSPHATE 4 MG/ML IJ SOLN
INTRAMUSCULAR | Status: DC | PRN
  Administered 2020-11-17: 5 mg via INTRAVENOUS

## 2020-11-17 MED ORDER — SODIUM CHLORIDE 0.9 % IR SOLN
Status: DC | PRN
  Administered 2020-11-17: 7500 mL

## 2020-11-17 MED ORDER — PROPOFOL 10 MG/ML IV BOLUS
INTRAVENOUS | Status: DC | PRN
  Administered 2020-11-17: 120 mg via INTRAVENOUS

## 2020-11-17 MED ORDER — MIDAZOLAM HCL 2 MG/2ML IJ SOLN
2.0000 mg | Freq: Once | INTRAMUSCULAR | Status: AC
  Administered 2020-11-17: 2 mg via INTRAVENOUS

## 2020-11-17 SURGICAL SUPPLY — 67 items
ANCHOR SUT 1.8 FBRTK KNTLS 2SU (Anchor) ×20 IMPLANT
APL PRP STRL LF DISP 70% ISPRP (MISCELLANEOUS) ×3
BLADE EXCALIBUR 4.0MM X 13CM (MISCELLANEOUS) ×1
BLADE EXCALIBUR 4.0X13 (MISCELLANEOUS) ×4 IMPLANT
BNDG COHESIVE 4X5 TAN STRL (GAUZE/BANDAGES/DRESSINGS) IMPLANT
BUR SURG 4D 13L RD FLUTE (BUR) ×3 IMPLANT
BURR OVAL 8 FLU 4.0MM X 13CM (MISCELLANEOUS) ×1
BURR OVAL 8 FLU 4.0X13 (MISCELLANEOUS) ×4 IMPLANT
BURR SURG 4D 13CML RD FLUTE (BUR) ×1
BURR SURG 4D 13L RD FLUTE (BUR) ×4
CANNULA 5.75X71 LONG (CANNULA) ×5 IMPLANT
CANNULA TWIST IN 8.25X7CM (CANNULA) ×10 IMPLANT
CHLORAPREP W/TINT 26 (MISCELLANEOUS) ×5 IMPLANT
CLOSURE STERI-STRIP 1/2X4 (GAUZE/BANDAGES/DRESSINGS) ×1
CLSR STERI-STRIP ANTIMIC 1/2X4 (GAUZE/BANDAGES/DRESSINGS) ×4 IMPLANT
COOLER ICEMAN CLASSIC (MISCELLANEOUS) ×5 IMPLANT
COVER WAND RF STERILE (DRAPES) IMPLANT
DECANTER SPIKE VIAL GLASS SM (MISCELLANEOUS) IMPLANT
DISSECTOR 3.5MM X 13CM CVD (MISCELLANEOUS) IMPLANT
DISSECTOR 4.0MMX13CM CVD (MISCELLANEOUS) IMPLANT
DRAPE IMP U-DRAPE 54X76 (DRAPES) ×5 IMPLANT
DRAPE INCISE IOBAN 66X45 STRL (DRAPES) IMPLANT
DRAPE SHOULDER BEACH CHAIR (DRAPES) ×5 IMPLANT
DRSG PAD ABDOMINAL 8X10 ST (GAUZE/BANDAGES/DRESSINGS) ×5 IMPLANT
DW OUTFLOW CASSETTE/TUBE SET (MISCELLANEOUS) ×5 IMPLANT
GAUZE SPONGE 4X4 12PLY STRL (GAUZE/BANDAGES/DRESSINGS) ×5 IMPLANT
GLOVE SRG 8 PF TXTR STRL LF DI (GLOVE) ×3 IMPLANT
GLOVE SURG ENC MOIS LTX SZ6.5 (GLOVE) ×10 IMPLANT
GLOVE SURG LTX SZ8 (GLOVE) ×5 IMPLANT
GLOVE SURG UNDER POLY LF SZ6.5 (GLOVE) ×5 IMPLANT
GLOVE SURG UNDER POLY LF SZ7 (GLOVE) ×10 IMPLANT
GLOVE SURG UNDER POLY LF SZ8 (GLOVE) ×5
GOWN STRL REUS W/ TWL LRG LVL3 (GOWN DISPOSABLE) ×6 IMPLANT
GOWN STRL REUS W/TWL LRG LVL3 (GOWN DISPOSABLE) ×10
GOWN STRL REUS W/TWL XL LVL3 (GOWN DISPOSABLE) ×5 IMPLANT
IV NS IRRIG 3000ML ARTHROMATIC (IV SOLUTION) ×15 IMPLANT
KIT POSITIONER SHLDR ASSISTARM (MISCELLANEOUS) ×5 IMPLANT
KIT STR SPEAR 1.8 FBRTK DISP (KITS) ×5 IMPLANT
LASSO 90 CVE QUICKPAS (DISPOSABLE) ×5 IMPLANT
LASSO CRESCENT QUICKPASS (SUTURE) IMPLANT
MANIFOLD NEPTUNE II (INSTRUMENTS) ×5 IMPLANT
NDL SAFETY ECLIPSE 18X1.5 (NEEDLE) ×3 IMPLANT
NEEDLE HYPO 18GX1.5 SHARP (NEEDLE) ×5
PACK ARTHROSCOPY DSU (CUSTOM PROCEDURE TRAY) ×5 IMPLANT
PACK BASIN DAY SURGERY FS (CUSTOM PROCEDURE TRAY) ×5 IMPLANT
PAD COLD SHLDR WRAP-ON (PAD) ×5 IMPLANT
PAD ORTHO SHOULDER 7X19 LRG (SOFTGOODS) ×5 IMPLANT
PORT APPOLLO RF 90DEGREE MULTI (SURGICAL WAND) ×5 IMPLANT
SHEET MEDIUM DRAPE 40X70 STRL (DRAPES) ×5 IMPLANT
SLEEVE ARM SUSPENSION SYSTEM (MISCELLANEOUS) IMPLANT
SLEEVE SCD COMPRESS KNEE MED (STOCKING) ×5 IMPLANT
SLING ARM FOAM STRAP LRG (SOFTGOODS) IMPLANT
SLING S3 LATERAL DISP (MISCELLANEOUS) IMPLANT
SUT FIBERWIRE #2 38 T-5 BLUE (SUTURE)
SUT MNCRL AB 4-0 PS2 18 (SUTURE) ×5 IMPLANT
SUT PDS AB 1 CT  36 (SUTURE) ×2
SUT PDS AB 1 CT 36 (SUTURE) ×3 IMPLANT
SUT TIGER TAPE 7 IN WHITE (SUTURE) IMPLANT
SUTURE FIBERWR #2 38 T-5 BLUE (SUTURE) IMPLANT
SUTURE TAPE TIGERLINK 1.3MM BL (SUTURE) IMPLANT
SUTURETAPE TIGERLINK 1.3MM BL (SUTURE)
SYR 5ML LL (SYRINGE) ×5 IMPLANT
TAPE FIBER 2MM 7IN #2 BLUE (SUTURE) IMPLANT
TOWEL GREEN STERILE FF (TOWEL DISPOSABLE) ×10 IMPLANT
TUBE CONNECTING 20'X1/4 (TUBING) ×1
TUBE CONNECTING 20X1/4 (TUBING) ×4 IMPLANT
TUBING ARTHROSCOPY IRRIG 16FT (MISCELLANEOUS) ×5 IMPLANT

## 2020-11-17 NOTE — Op Note (Signed)
Orthopaedic Surgery Operative Note (CSN: [id number])  SOU NOHR  08/13/1980 Date of Surgery: 11/17/2020   Diagnoses:  Left shoulder instability, impingement   Procedure: Arthroscopic extensive debridement Arthroscopic subacromial decompression Arthroscopic labral repair and capsulorrhaphy   Operative Finding Exam under anesthesia: Patient had full motion no limitation, he had a anterior click consistent with instability was reproducible, no posterior instability Articular space: No loose bodies, capsule intact, small anterior inferior labral tear from the 9:00 to 6 o'clock position.   Chondral surfaces: There is undersurface delamination of the anterior-inferior quadrant of the glenoid but the cartilage is intact.  It was softened in this area.  Concerning for eventual cartilage loss or loose body formation however there was no reason to takedown this area currently.  Our anchors were placed into this area and we felt that this may help stimulate healing potential of this cartilage by stimulating the bone to be reactive. Biceps: Normal Subscapularis: Normal Superior Cuff: Normal Bursal side: Significant bursitis and a small wear pattern on the CA ligament  Successful completion of the planned procedure.  Patient had some instability concerns on his exam under anesthesia and we felt that balancing how tightly made his shoulder be appropriate.  He does have a high risk for loose body formation with that anterior glenoid delamination however we did not feel that taking down cartilage would be in his best interest.  If he fails the surgery he may be a candidate for a revision stabilization arthroscopically in the lateral position if needed as his tissue quality was reasonable.  We used 3 anchors for this repair.   Post-operative plan: The patient will be non-weightbearing in a sling with therapy to start at week 2-3 following her anterior capsulorrhaphy protocol.  The patient will be  discharged home.  DVT prophylaxis not indicated in ambulatory upper extremity patient without known risk factors.   Pain control with PRN pain medication preferring oral medicines.  Follow up plan will be scheduled in approximately 7 days for incision check and XR.  Post-Op Diagnosis: Same Surgeons:Primary: Hiram Gash, MD Assistants:Caroline McBane PA-C Location: [facility] OR ROOM 6 Anesthesia: General with Exparel interscalene block Antibiotics: Ancef 2 g Tourniquet time: None Estimated Blood Loss: Minimal Complications: None Specimens: None Implants: Implant Name Type Inv. Item Serial No. Manufacturer Lot No. LRB No. Used Action  ANCHOR SUT 1.8 FBRTK KNTLS 2SU - [id number] Anchor ANCHOR SUT 1.8 FBRTK KNTLS 2SU  [employer] [id number] Left 1 Implanted  ANCHOR SUT 1.8 FBRTK KNTLS 2SU - [id number] Anchor ANCHOR SUT 1.8 FBRTK KNTLS 2SU  [employer] [id number] Left 1 Implanted  ANCHOR SUT 1.8 FBRTK KNTLS 2SU - [id number] Anchor ANCHOR SUT 1.8 FBRTK KNTLS 2SU  [employer] [id number] Left 1 Implanted  ANCHOR SUT 1.8 FBRTK KNTLS 2SU - [id number] Anchor ANCHOR SUT 1.8 FBRTK [employer] [id number]  1 Implanted    Indications for Surgery:   WESTLEY BLASS is a 40 y.o. male with continued shoulder pain refractory to nonoperative measures for extended period of time.  He had signs of anterior instability and provocative testing consistent with this with MRI demonstrating small anterior inferior labral tear.  The risks and benefits were explained at length including but not limited to continued pain, recurrent instability, biceps pathology and future cuff tears amongst others.  Procedure:   Patient was correctly identified in the preoperative holding area and operative site marked.  Patient brought to OR and positioned beachchair on an [place] table ensuring that  all bony prominences were padded and the head was in an appropriate location.  Anesthesia was induced and the operative shoulder was prepped and  draped in the usual sterile fashion.  Timeout was called preincision.  A standard posterior viewing portal was made after localizing the portal with a spinal needle.  An anterior accessory portal was also made.  After clearing the articular space the camera was positioned in the subacromial space.  Findings above.    Extensive debridement was performed of the anterior interval tissue, labral fraying and the bursa.  Subacromial decompression: We made a lateral portal with spinal needle guidance. We then proceeded to debride bursal tissue extensively with a shaver and arthrocare device. At that point we continued to identify the borders of the acromion and identify the spur. We then carefully preserved the deltoid fascia and used a burr to convert the acromion to a Type 1 flat acromion without issue.  Labral repair: We initially used a shaver debride unhealthy appearing labrum and identified the tear from the 9 to 6 o'clock position.  There is no extension of the tear posteriorly.  We then used a elevator device to elevate the labral tissue and prepare the bone for healing.  We shaved any debris.  At that point we placed 2 anterior fiber tack 1.8 anchors, knotless in typical fashion.  We used a suture lasso device to obtain purchase of the capsule as well as the labrum and anatomically reduce the labral tissue.  We created a nice bumper to help center the head.  We then placed 1 posterior anchor at the 5:30 position to help lift the IGH L back into position.  We had nicely tensioned the construct at this point.  Head was centered.  The incisions were closed with absorbable monocryl and steri strips.  A sterile dressing was placed along with a sling. The patient was awoken from general anesthesia and taken to the PACU in stable condition without complication.   Noemi Chapel, PA-C, present and scrubbed throughout the case, critical for completion in a timely fashion, and for retraction, instrumentation,  closure.

## 2020-11-17 NOTE — Progress Notes (Signed)
Assisted Dr. Moser with left, ultrasound guided, interscalene  block. Side rails up, monitors on throughout procedure. See vital signs in flow sheet. Tolerated Procedure well. 

## 2020-11-17 NOTE — Interval H&P Note (Signed)
History and Physical Interval Note:  11/17/2020 8:23 AM  Joe Griffith  has presented today for surgery, with the diagnosis of LEFT SHOULDER BURSITIS CARTILAGE DISORDERS, BICIPITAL TENDINITIS AND DISLOCATION.  The various methods of treatment have been discussed with the patient and family. After consideration of risks, benefits and other options for treatment, the patient has consented to  Procedure(s): LEFT SHOULDER ARTHROSCOPY WITH SUBACROMIAL DECOMPRESSION,  BICEP TENODESIS AND CAPSULORRHAPHY (Left) as a surgical intervention.  The patient's history has been reviewed, patient examined, no change in status, stable for surgery.  I have reviewed the patient's chart and labs.  Questions were answered to the patient's satisfaction.     Bjorn Pippin

## 2020-11-17 NOTE — Anesthesia Procedure Notes (Signed)
Procedure Name: Intubation Date/Time: 11/17/2020 8:37 AM Performed by: Thornell Mule, CRNA Pre-anesthesia Checklist: Patient identified, Emergency Drugs available, Suction available and Patient being monitored Patient Re-evaluated:Patient Re-evaluated prior to induction Oxygen Delivery Method: Circle system utilized Preoxygenation: Pre-oxygenation with 100% oxygen Induction Type: IV induction Ventilation: Mask ventilation without difficulty Laryngoscope Size: Miller and 3 Grade View: Grade I Tube type: Oral Tube size: 7.5 mm Number of attempts: 1 Airway Equipment and Method: Stylet and Oral airway Placement Confirmation: ETT inserted through vocal cords under direct vision, positive ETCO2 and breath sounds checked- equal and bilateral Secured at: 21 cm Tube secured with: Tape Dental Injury: Teeth and Oropharynx as per pre-operative assessment

## 2020-11-17 NOTE — Discharge Instructions (Addendum)
Ramond Marrow MD, MPH Alfonse Alpers, PA-C [facility] [address] [phone] (tel)   [phone] (fax)   POST-OPERATIVE INSTRUCTIONS - SHOULDER ARTHROSCOPY  WOUND CARE You may remove the Operative Dressing on Post-Op Day #3 (72hrs after surgery).   Alternatively if you would like you can leave dressing on until follow-up if within 7-8 days but keep it dry. Leave steri-strips in place until they fall off on their own, usually 2 weeks postop. There may be a small amount of fluid/bleeding leaking at the surgical site.  This is normal; the shoulder is filled with fluid during the procedure and can leak for 24-48hrs after surgery.  You may change/reinforce the bandage as needed.  Use the Cryocuff or Ice as often as possible for the first 7 days, then as needed for pain relief. Always keep a towel, ACE wrap or other barrier between the cooling unit and your skin.  You may shower on Post-Op Day #3. Gently pat the area dry. Do not soak the shoulder in water or submerge it. Keep incisions as dry as possible. Do not go swimming in the pool or ocean until 4 weeks after surgery or when otherwise instructed.    EXERCISES/BRACING Sling should be used at all times until follow-up.  You can remove sling for hygiene.    Please continue to ambulate and do not stay sitting or lying for too long. Perform foot and wrist pumps to assist in circulation.  POST-OP MEDICATIONS- Multimodal approach to pain control In general your pain will be controlled with a combination of substances.  Prescriptions unless otherwise discussed are electronically sent to your pharmacy.  This is a carefully made plan we use to minimize narcotic use.     Meloxicam - Anti-inflammatory medication taken on a scheduled basis Acetaminophen - Non-narcotic pain medicine taken on a scheduled basis  Oxycodone - This is a strong narcotic, to be used only on an "as needed" basis for severe pain. Zofran -  take as needed for nausea   FOLLOW-UP If you develop a Fever (?101.5), Redness or Drainage from the surgical incision site, please call our office to arrange for an evaluation. Please call the office to schedule a follow-up appointment for your suture removal, 10-14 days post-operatively.    HELPFUL INFORMATION  If you had a block, it will wear off between 8-24 hrs postop typically.  This is period when your pain may go from nearly zero to the pain you would have had postop without the block.  This is an abrupt transition but nothing dangerous is happening.  You may take an extra dose of narcotic when this happens.  You may be more comfortable sleeping in a semi-seated position the first few nights following surgery.  Keep a pillow propped under the elbow and forearm for comfort.  If you have a recliner type of chair it might be beneficial.  If not that is fine too, but it would be helpful to sleep propped up with pillows behind your operated shoulder as well under your elbow and forearm.  This will reduce pulling on the suture lines.  When dressing, put your operative arm in the sleeve first.  When getting undressed, take your operative arm out last.  Loose fitting, button-down shirts are recommended.  Often in the first days after surgery you may be more comfortable keeping your operative arm under your shirt and not through the sleeve.  You may return to work/school in the next couple of  days when you feel up to it.  Desk work and typing in the sling is fine.  We suggest you use the pain medication the first night prior to going to bed, in order to ease any pain when the anesthesia wears off. You should avoid taking pain medications on an empty stomach as it will make you nauseous.  You should wean off your narcotic medicines as soon as you are able.  Most patients will be off or using minimal narcotics before their first postop appointment.   Do not drink alcoholic beverages or take  illicit drugs when taking pain medications.  It is against the law to drive while taking narcotics.  In some states it is against the law to drive while your arm is in a sling.   Pain medication may make you constipated.  Below are a few solutions to try in this order: Decrease the amount of pain medication if you aren't having pain. Drink lots of decaffeinated fluids. Drink prune juice and/or eat dried prunes  If the first 3 don't work start with additional solutions Take Colace - an over-the-counter stool softener Take Senokot - an over-the-counter laxative Take Miralax - a stronger over-the-counter laxative  For more information including helpful videos and documents visit our website:   [email or web address]   Post Anesthesia Home Care Instructions  Activity: Get plenty of rest for the remainder of the day. A responsible individual must stay with you for 24 hours following the procedure.  For the next 24 hours, DO NOT: -Drive a car -Advertising copywriter -Drink alcoholic beverages -Take any medication unless instructed by your physician -Make any legal decisions or sign important papers.  Meals: Start with liquid foods such as gelatin or soup. Progress to regular foods as tolerated. Avoid greasy, spicy, heavy foods. If nausea and/or vomiting occur, drink only clear liquids until the nausea and/or vomiting subsides. Call your physician if vomiting continues.  Special Instructions/Symptoms: Your throat may feel dry or sore from the anesthesia or the breathing tube placed in your throat during surgery. If this causes discomfort, gargle with warm salt water. The discomfort should disappear within 24 hours.  If you had a scopolamine patch placed behind your ear for the management of post- operative nausea and/or vomiting:  1. The medication in the patch is effective for 72 hours, after which it should be removed.  Wrap patch in a tissue and discard in  the trash. Wash hands thoroughly with soap and water. 2. You may remove the patch earlier than 72 hours if you experience unpleasant side effects which may include dry mouth, dizziness or visual disturbances. 3. Avoid touching the patch. Wash your hands with soap and water after contact with the patch.    Regional Anesthesia Blocks  1. Numbness or the inability to move the "blocked" extremity may last from 3-48 hours after placement. The length of time depends on the medication injected and your individual response to the medication. If the numbness is not going away after 48 hours, call your surgeon.  2. The extremity that is blocked will need to be protected until the numbness is gone and the  Strength has returned. Because you cannot feel it, you will need to take extra care to avoid injury. Because it may be weak, you may have difficulty moving it or using it. You may not know what position it is in without looking at it while the block is in effect.  3. For blocks in the legs and feet,  returning to weight bearing and walking needs to be done carefully. You will need to wait until the numbness is entirely gone and the strength has returned. You should be able to move your leg and foot normally before you try and bear weight or walk. You will need someone to be with you when you first try to ensure you do not fall and possibly risk injury.  4. Bruising and tenderness at the needle site are common side effects and will resolve in a few days.  5. Persistent numbness or new problems with movement should be communicated to the surgeon or the [facility] [id number] [facility] [phone]). Information for Discharge Teaching: EXPAREL (bupivacaine liposome injectable suspension)   Your surgeon or anesthesiologist gave you EXPAREL(bupivacaine) to help control your pain after surgery.  EXPAREL is a local anesthetic that provides pain relief by numbing the tissue around the  surgical site. EXPAREL is designed to release pain medication over time and can control pain for up to 72 hours. Depending on how you respond to EXPAREL, you may require less pain medication during your recovery.  Possible side effects: Temporary loss of sensation or ability to move in the area where bupivacaine was injected. Nausea, vomiting, constipation Rarely, numbness and tingling in your mouth or lips, lightheadedness, or anxiety may occur. Call your doctor right away if you think you may be experiencing any of these sensations, or if you have other questions regarding possible side effects.  Follow all other discharge instructions given to you by your surgeon or nurse. Eat a healthy diet and drink plenty of water or other fluids.  If you return to the hospital for any reason within 96 hours following the administration of EXPAREL, it is important for health care providers to know that you have received this anesthetic. A teal colored band has been placed on your arm with the date, time and amount of EXPAREL you have received in order to alert and inform your health care providers. Please leave this armband in place for the full 96 hours following administration, and then you may remove the band.

## 2020-11-17 NOTE — Anesthesia Preprocedure Evaluation (Addendum)
Anesthesia Evaluation  Patient identified by MRN, date of birth, ID band Patient awake    Reviewed: Allergy & Precautions, NPO status , Patient's Chart, lab work & pertinent test results  History of Anesthesia Complications Negative for: history of anesthetic complications  Airway Mallampati: I  TM Distance: >3 FB Neck ROM: Full    Dental  (+) Dental Advisory Given, Teeth Intact   Pulmonary neg shortness of breath, neg sleep apnea, neg COPD, neg recent URI,  Covid-19 Nucleic Acid Test Results Lab Results      Component                Value               Date                      SARSCOV2NAA              Not Detected        06/30/2019                SARSCOV2NAA              Not Detected        06/04/2019              breath sounds clear to auscultation       Cardiovascular negative cardio ROS   Rhythm:Regular     Neuro/Psych negative neurological ROS  negative psych ROS   GI/Hepatic negative GI ROS, Neg liver ROS,   Endo/Other  negative endocrine ROS  Renal/GU negative Renal ROS     Musculoskeletal negative musculoskeletal ROS (+)   Abdominal   Peds  Hematology negative hematology ROS (+)   Anesthesia Other Findings   Reproductive/Obstetrics                             Anesthesia Physical Anesthesia Plan  ASA: 1  Anesthesia Plan: General and Regional   Post-op Pain Management:  Regional for Post-op pain   Induction: Intravenous  PONV Risk Score and Plan: 2 and Ondansetron and Dexamethasone  Airway Management Planned: Oral ETT  Additional Equipment: None  Intra-op Plan:   Post-operative Plan: Extubation in OR  Informed Consent: I have reviewed the patients History and Physical, chart, labs and discussed the procedure including the risks, benefits and alternatives for the proposed anesthesia with the patient or authorized representative who has indicated his/her  understanding and acceptance.     Dental advisory given  Plan Discussed with: CRNA and Surgeon  Anesthesia Plan Comments:         Anesthesia Quick Evaluation

## 2020-11-17 NOTE — Transfer of Care (Signed)
Immediate Anesthesia Transfer of Care Note  Patient: Joe Griffith  Procedure(s) Performed: LEFT SHOULDER ARTHROSCOPY WITH SUBACROMIAL DECOMPRESSION,CAPSULORRHAPHY (Left: Shoulder) SHOULDER ATHROSCOPY WITH CAPSULORRHAPHY (Shoulder)  Patient Location: PACU  Anesthesia Type:General  Level of Consciousness: drowsy, patient cooperative and responds to stimulation  Airway & Oxygen Therapy: Patient Spontanous Breathing and Patient connected to face mask oxygen  Post-op Assessment: Report given to RN and Post -op Vital signs reviewed and stable  Post vital signs: Reviewed and stable  Last Vitals:  Vitals Value Taken Time  BP    Temp    Pulse    Resp    SpO2      Last Pain:  Vitals:   11/17/20 0650  TempSrc: Oral  PainSc: 0-No pain         Complications: No notable events documented.

## 2020-11-17 NOTE — Anesthesia Procedure Notes (Signed)
Anesthesia Regional Block: Interscalene brachial plexus block   Pre-Anesthetic Checklist: , timeout performed,  Correct Patient, Correct Site, Correct Laterality,  Correct Procedure, Correct Position, site marked,  Risks and benefits discussed,  Surgical consent,  Pre-op evaluation,  At surgeon's request and post-op pain management  Laterality: Left and Upper  Prep: chloraprep       Needles:  Injection technique: Single-shot      Needle Length: 5cm  Needle Gauge: 22     Additional Needles: Arrow StimuQuik ECHO Echogenic Stimulating PNB Needle  Procedures:,,,, ultrasound used (permanent image in chart),,    Narrative:  Start time: 11/17/2020 8:09 AM End time: 11/17/2020 8:13 AM Injection made incrementally with aspirations every 5 mL.  Performed by: Personally  Anesthesiologist: Val Eagle, MD

## 2020-11-18 ENCOUNTER — Encounter (HOSPITAL_BASED_OUTPATIENT_CLINIC_OR_DEPARTMENT_OTHER): Payer: Self-pay | Admitting: Orthopaedic Surgery

## 2020-11-21 NOTE — Anesthesia Postprocedure Evaluation (Signed)
Anesthesia Post Note  Patient: Joe Griffith  Procedure(s) Performed: LEFT SHOULDER ARTHROSCOPY WITH SUBACROMIAL DECOMPRESSION,CAPSULORRHAPHY (Left: Shoulder) SHOULDER ATHROSCOPY WITH CAPSULORRHAPHY (Shoulder)     Patient location during evaluation: PACU Anesthesia Type: Regional and General Level of consciousness: awake and alert Pain management: pain level controlled Vital Signs Assessment: post-procedure vital signs reviewed and stable Respiratory status: spontaneous breathing, nonlabored ventilation, respiratory function stable and patient connected to nasal cannula oxygen Cardiovascular status: blood pressure returned to baseline and stable Postop Assessment: no apparent nausea or vomiting Anesthetic complications: no   No notable events documented.  Last Vitals:  Vitals:   11/17/20 1000 11/17/20 1039  BP: 135/81 138/84  Pulse: 70 81  Resp: (!) 27 17  Temp:  36.6 C  SpO2: 99% 95%    Last Pain:  Vitals:   11/18/20 1019  TempSrc:   PainSc: 2                  Everline Mahaffy

## 2020-11-26 ENCOUNTER — Other Ambulatory Visit (HOSPITAL_BASED_OUTPATIENT_CLINIC_OR_DEPARTMENT_OTHER): Payer: Self-pay

## 2020-11-26 MED ORDER — METHYLPREDNISOLONE 4 MG PO TBPK
ORAL_TABLET | ORAL | 0 refills | Status: AC
  Filled 2020-11-26: qty 21, 6d supply, fill #0

## 2021-12-26 ENCOUNTER — Other Ambulatory Visit (HOSPITAL_BASED_OUTPATIENT_CLINIC_OR_DEPARTMENT_OTHER): Payer: Self-pay

## 2021-12-26 MED ORDER — SULFAMETHOXAZOLE-TRIMETHOPRIM 800-160 MG PO TABS
ORAL_TABLET | ORAL | 0 refills | Status: AC
  Filled 2021-12-26: qty 14, 7d supply, fill #0

## 2022-01-03 ENCOUNTER — Other Ambulatory Visit (HOSPITAL_BASED_OUTPATIENT_CLINIC_OR_DEPARTMENT_OTHER): Payer: Self-pay

## 2022-01-03 MED ORDER — NAPROXEN 500 MG PO TABS
ORAL_TABLET | ORAL | 0 refills | Status: AC
  Filled 2022-01-03: qty 20, 10d supply, fill #0

## 2022-01-03 MED ORDER — CIPROFLOXACIN HCL 500 MG PO TABS
ORAL_TABLET | ORAL | 0 refills | Status: AC
  Filled 2022-01-03: qty 20, 10d supply, fill #0

## 2022-02-27 ENCOUNTER — Other Ambulatory Visit (HOSPITAL_BASED_OUTPATIENT_CLINIC_OR_DEPARTMENT_OTHER): Payer: Self-pay

## 2022-02-27 MED ORDER — SILODOSIN 8 MG PO CAPS
8.0000 mg | ORAL_CAPSULE | Freq: Every day | ORAL | 11 refills | Status: AC
  Filled 2022-02-27: qty 30, 30d supply, fill #0

## 2022-02-27 MED ORDER — TAMSULOSIN HCL 0.4 MG PO CAPS
0.4000 mg | ORAL_CAPSULE | Freq: Every day | ORAL | 0 refills | Status: AC
  Filled 2022-02-27: qty 30, 30d supply, fill #0

## 2022-03-12 IMAGING — XA DG FLUORO GUIDE NDL PLC/BX
1 series · 1 of 1 positions shown · non-contrast
Comparison: none

CLINICAL DATA: Left shoulder pain after falling.

[Series 1: ortho adipose · 1 of 1 slices shown]
[im 1/1]
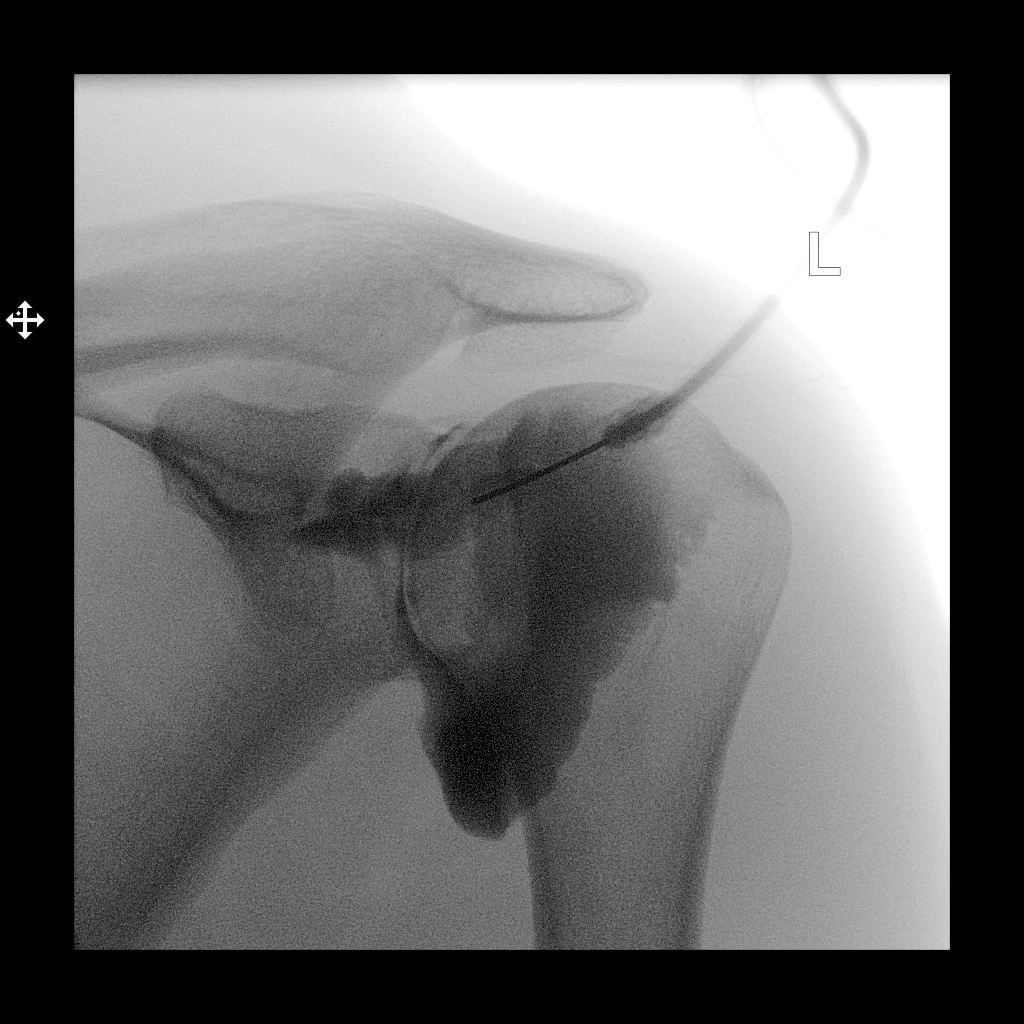

[1 of 1 positions shown; findings below may reference images not displayed]

FLUOROSCOPY TIME:  Radiation Exposure Index (as provided by the
fluoroscopic device): Left

PROCEDURE:
RIGHT/LEFT SHOULDER INJECTION UNDER FLUOROSCOPY

An appropriate skin entrance site was determined. The site was
marked, prepped with Betadine, draped in the usual sterile fashion,
and infiltrated locally with buffered Lidocaine. 22 gauge spinal
needle was advanced to the superomedial margin of the humeral head
under intermittent fluoroscopy. 1 ml of Lidocaine injected easily. A
mixture of 0.1 ml Multihance and 20 ml of dilute Isovue M 200 was
then used to opacify the left shoulder capsule. No immediate
complication.
IMPRESSION: Technically successful left shoulder injection for MRI.

## 2022-06-19 ENCOUNTER — Other Ambulatory Visit (HOSPITAL_BASED_OUTPATIENT_CLINIC_OR_DEPARTMENT_OTHER): Payer: Self-pay

## 2022-06-19 MED ORDER — PEG 3350-KCL-NABCB-NACL-NASULF 236 G PO SOLR
ORAL | 0 refills | Status: AC
  Filled 2022-06-19: qty 4000, 1d supply, fill #0

## 2022-06-20 ENCOUNTER — Other Ambulatory Visit (HOSPITAL_BASED_OUTPATIENT_CLINIC_OR_DEPARTMENT_OTHER): Payer: Self-pay
